# Patient Record
Sex: Male | Born: 1955 | Race: White | Hispanic: No | Marital: Married | Smoking: Never smoker
Health system: Southern US, Community
[De-identification: ages and names within clinical notes are randomized; demographics above are authoritative.]

## PROBLEM LIST (undated history)

## (undated) DIAGNOSIS — M069 Rheumatoid arthritis, unspecified: Secondary | ICD-10-CM

## (undated) DIAGNOSIS — Z91018 Allergy to other foods: Secondary | ICD-10-CM

## (undated) DIAGNOSIS — G4730 Sleep apnea, unspecified: Secondary | ICD-10-CM

## (undated) HISTORY — PX: CHOLECYSTECTOMY: SHX55

## (undated) HISTORY — PX: TONSILLECTOMY: SUR1361

## (undated) HISTORY — DX: Allergy to other foods: Z91.018

## (undated) HISTORY — DX: Rheumatoid arthritis, unspecified: M06.9

---

## 2000-10-27 HISTORY — PX: ANTERIOR FUSION CERVICAL SPINE: SUR626

## 2008-05-25 ENCOUNTER — Inpatient Hospital Stay (HOSPITAL_COMMUNITY): Admission: RE | Admit: 2008-05-25 | Discharge: 2008-05-29 | Payer: Self-pay | Admitting: Neurosurgery

## 2008-10-27 HISTORY — PX: OTHER SURGICAL HISTORY: SHX169

## 2008-10-27 HISTORY — PX: LUMBAR FUSION: SHX111

## 2009-11-13 IMAGING — CR DG CHEST 2V
2 series · 2 of 2 positions shown · non-contrast
Comparison: None

CLINICAL DATA: Preadmission for lumbar surgery

CHEST - 2 VIEW

[view not recorded (1 of 2)]
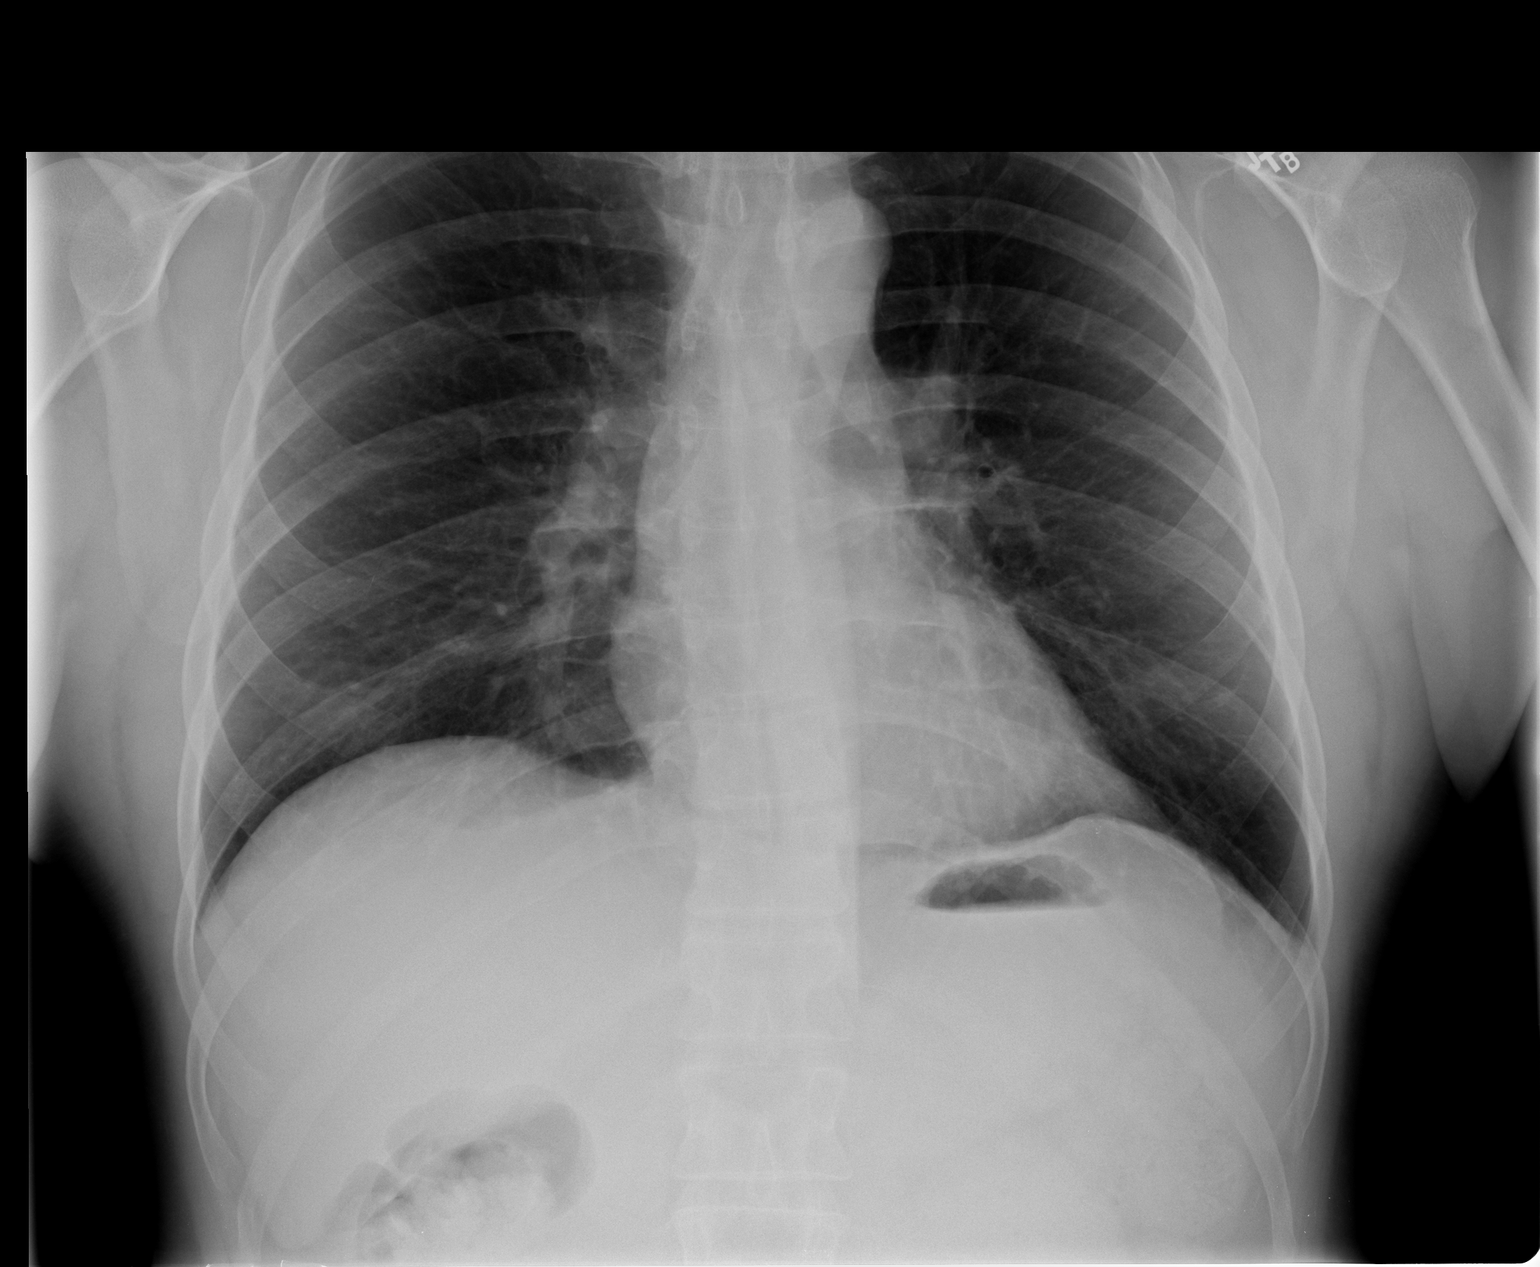

[view not recorded (2 of 2)]
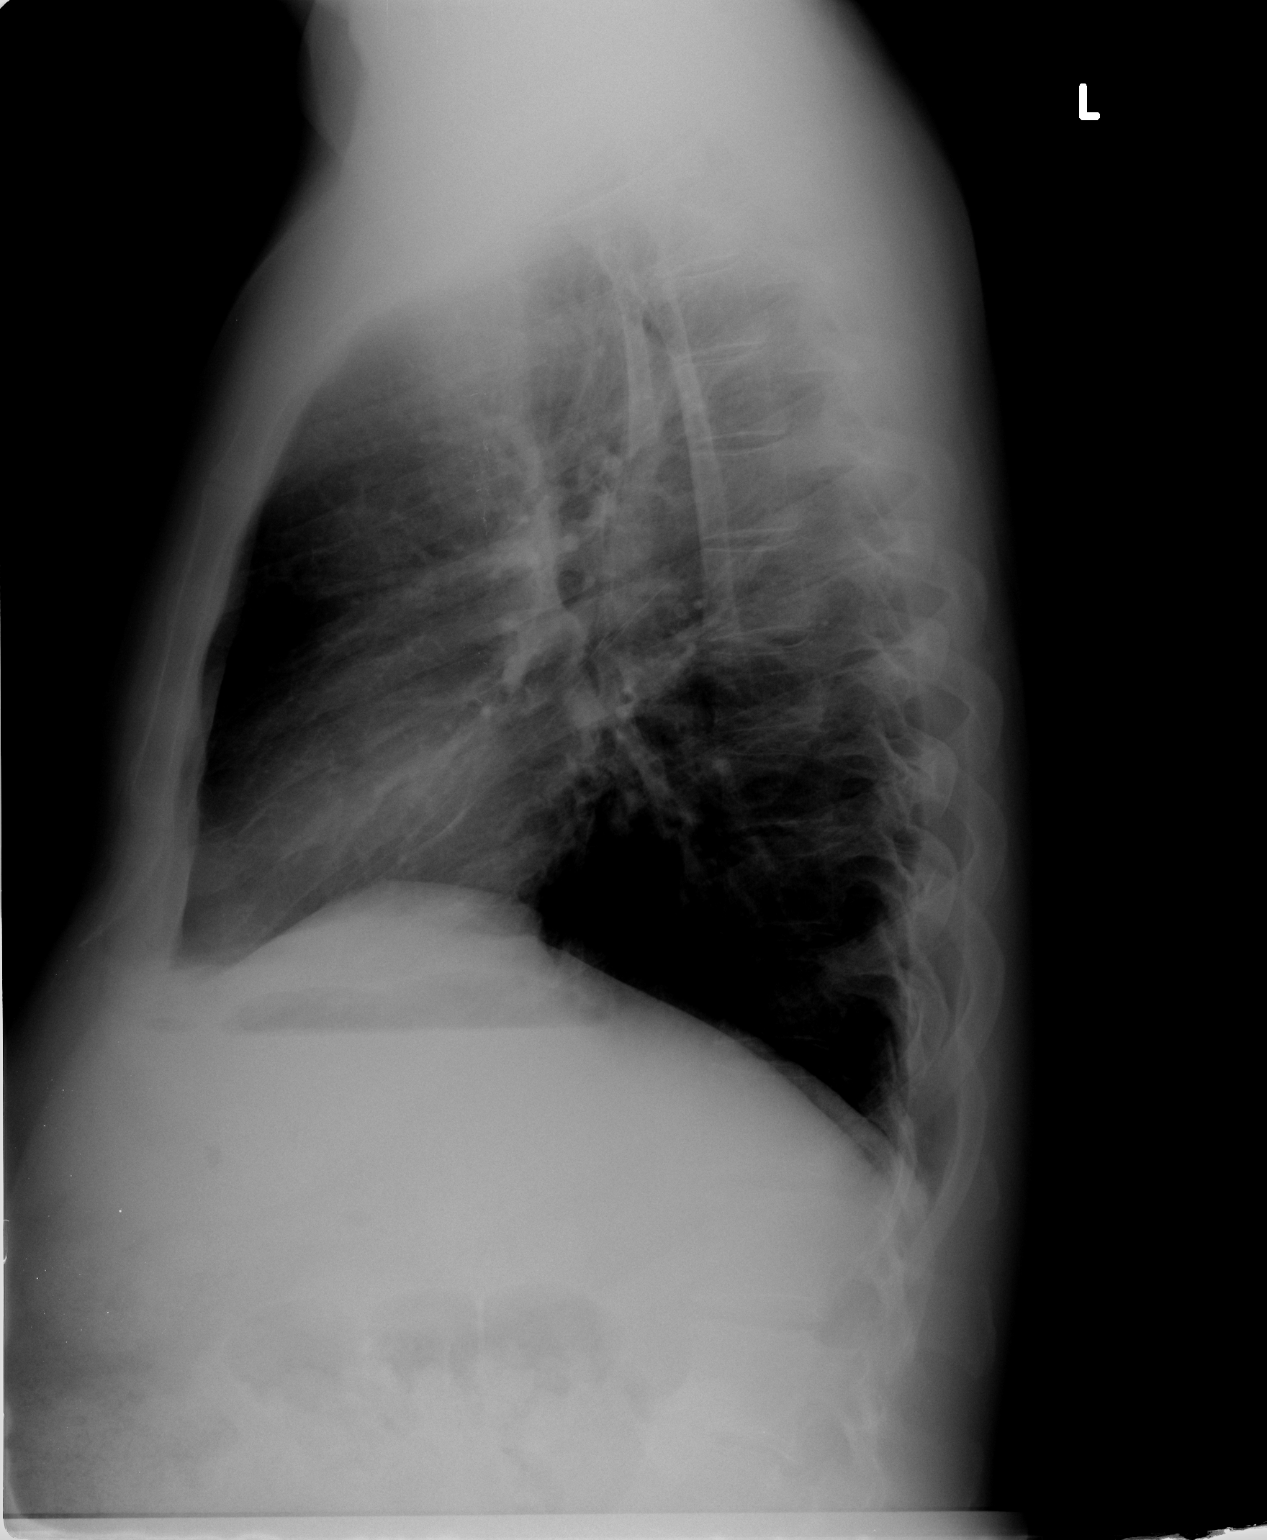

[2 of 2 positions shown; findings below may reference images not displayed]

FINDINGS: Cardiomediastinal silhouette is unremarkable.  No acute
infiltrate or pleural effusion.  No pulmonary edema.  Bony thorax
is unremarkable.
IMPRESSION: No active cardiopulmonary disease.

## 2009-11-19 IMAGING — CR DG LUMBAR SPINE 2-3V
1 series · 1 of 1 positions shown · non-contrast
Comparison: None

CLINICAL DATA: History is given of lumbar spondylosis and
degenerative disc disease and radiculopathy.  History given of
surgery for L2-L3 decompression and fusion.  Localization images
were obtained.

LUMBAR SPINE - 2-3 VIEW

[view not recorded]
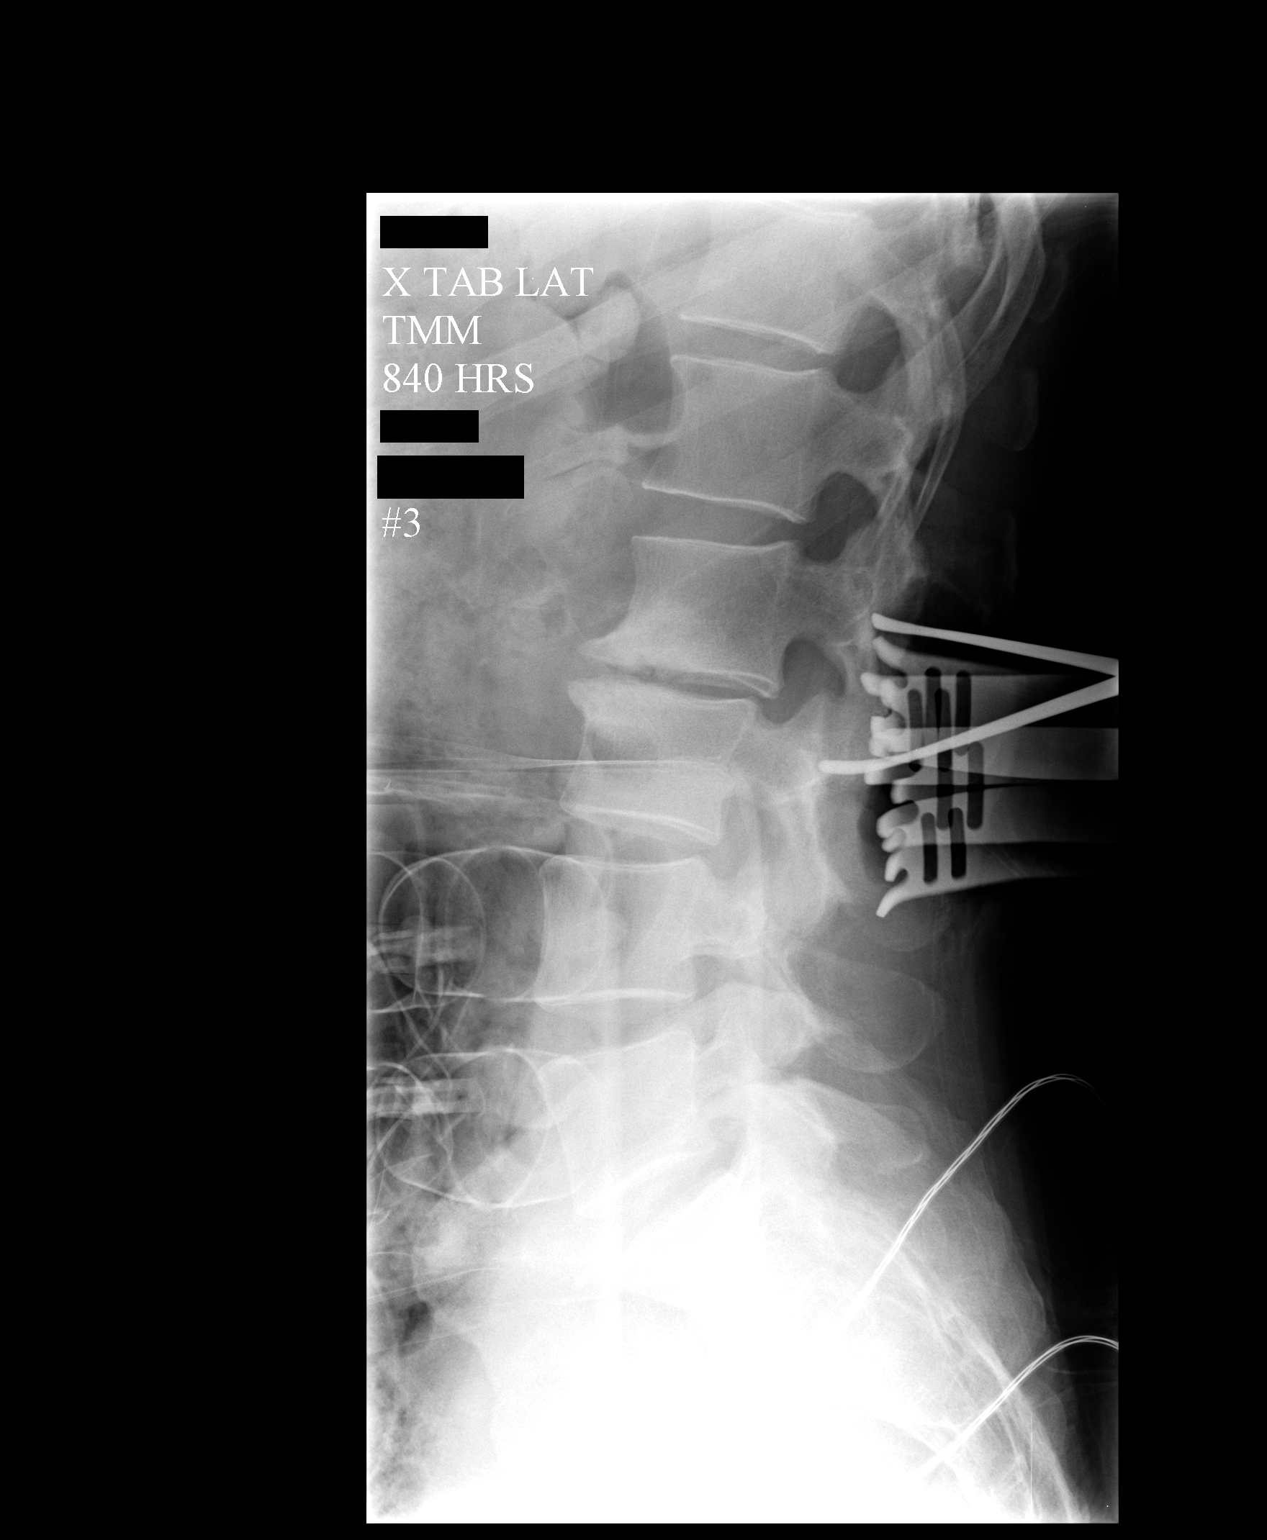

[1 of 1 positions shown; findings below may reference images not displayed]

FINDINGS: On the cross-table image obtained at 8872 hours metallic
instrument is seen with its tip posterior to the L3 level.  A
second metallic instrument has its tip posterior to the L4 level.

On the cross-table portable lateral examination obtained at 3438
hours the tip of a metallic instrument is seen posterior to the L2
level.

On the cross-table lateral image obtained at 8938 hours a metallic
instrument has its tip posterior to the L2 level and another
metallic instrument has its tip posterior to the L3 level.
IMPRESSION: Localization images obtained.

## 2010-04-26 HISTORY — PX: COLONOSCOPY: SHX174

## 2011-03-11 NOTE — Op Note (Signed)
Shawn Morgan, Shawn Morgan             ACCOUNT NO.:  [id number]   MEDICAL RECORD NO.:  [id number]          PATIENT TYPE:  INP   LOCATION:  3041                         FACILITY:  [facility]   PHYSICIAN:  Danae Orleans. Venetia Maxon, M.D.  DATE OF BIRTH:  1956/09/25   DATE OF PROCEDURE:  05/25/2008  DATE OF DISCHARGE:                               OPERATIVE REPORT   PREOPERATIVE DIAGNOSES:  Herniated lumbar disk with spondylosis,  spondylolisthesis, degenerative disease and radiculopathy L2-L3 level.   POSTOPERATIVE DIAGNOSES:  Herniated lumbar disk with spondylosis,  spondylolisthesis, degenerative disease and radiculopathy L2-L3 level.   PROCEDURE:  L2-L3 decompressive laminectomy with diskectomy, PLIF fusion  with PEEK interbody cage with morselized bone autograft, bone  morphogenic protein, nonsegmental instrumentation L2-L3 levels with  posterolateral arthrodesis utilizing autograft BMP and FortrOss.   SURGEON:  Danae Orleans. Venetia Maxon, MD   ASSISTANTS:  Georgiann Cocker, RN and Dr. Lovell Sheehan.   ANESTHESIA:  General endotracheal anesthesia.   ESTIMATED BLOOD LOSS:  650 mL with 205 mL Cell Saver blood returned to  the patient.   COMPLICATIONS:  None.   DISPOSITION:  Recovery.   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 with severe  intractable back pain with a markedly degenerated L2-L3 disk level with  rheumatoid arthritis.  It was elected to take him to surgery for  decompression and fusion at this affected level.   PROCEDURE:  [name] was brought to the operating room.  Following a  satisfactory and uncomplicated induction of general endotracheal  anesthesia plus intravenous lines, the patient was placed in a prone  position on the [place] table.  His low back was shaved, prepped and  draped in usual sterile fashion.  The planned incision was infiltrated  with 0.25% Marcaine and 0.50% lidocaine with 1:200,000 epinephrine.  Incision was made midline after localizing radiograph was  obtained with  the spinal needle which was at the level of the L2 pedicles.  The  incision was carried to the lumbodorsal fascia and was incised  bilaterally.  A subperiosteal dissection was performed exposing the  transverse processes of L3.  A marker probe was placed and demonstrated  a marker at L3-L4 level and subsequently exposure was carried cephalad  to expose the L2-L3 transverse processes.  Self-retaining retractor was  placed.  A hemilaminectomy was performed on the right side of the  midline and decompressing the thecal sac at L2 and L3 nerve roots.  There was marked spondylosis at this level and an osteophyte tool was  used to remove the retrolisthesed aspect of L2 disk.  Lamina spreaders  were placed to facilitate exposure of the interspace.  Interspace was  incised.  Disk material was removed in piecemeal fashion.  Endplates  were stripped of residual disk material.  After an initial attempt to  place a TLIF cage, it was found that there was not sufficient  distraction to be able to do so and subsequently it was elected to place  pedicle screws on the left side of midline.  These were 50 mm x 5.5-mm  screws placed at L3 and 45 x 5.5-mm screws placed  at L2, a 50-mm rod was  placed in the interspaces distracted.  Using this additional  distraction, the endplates were further prepared for grafting and  subsequently a 7-mm banana shaped medium TLIF cage was packed with extra  small BMP and FortrOss.  Additional small piece of BMP and bone  autograft was inserted in the interspace and tamped into position.  The  implant was then placed into the interspace and countersunk  appropriately.  Additional bone autograft was placed overlying the  implant and tamped into position.  A 45 x 5.5-mm screws were then placed  at L2-L3 on the right and subsequently AP and lateral fluoroscopy was  used to confirm correct orientation of implants and well-positioned  pedicle screw fixation.  The  facet joint complex of L2-L3 on the left  was then decorticated as were the overlying laminar bone and additional  remaining BMP and FortrOss was then placed and tamped into position.  The 50-mm rods were then placed and locked down in situ and torqued  appropriately.  The self-retaining retractor was removed.  The  lumbodorsal fascia was closed with 1 Vicryl sutures, subcutaneous  tissues were approximated with 2-0 Vicryl interrupted inverted sutures  and skin edges were approximated with interrupted 3-0 Vicryl  subcuticular stitch.  The wound was dressed with Benzoin, Steri-Strips,  Telfa gauze and tape.  The patient was extubated in the operating room  and returned to recovery in stable and satisfactory condition having  tolerated this operation well.  Counts were correct at the end of the  case.      Danae Orleans. Venetia Maxon, M.D.  Electronically Signed     JDS/MEDQ  D:  05/25/2008  T:  05/25/2008  Job:  [id number]

## 2011-03-14 NOTE — Discharge Summary (Signed)
NAMEJATHEN, SUDANO             ACCOUNT NO.:  [id number]   MEDICAL RECORD NO.:  [id number]          PATIENT TYPE:  INP   LOCATION:  3041                         FACILITY:  [facility]   PHYSICIAN:  Danae Orleans. Venetia Maxon, M.D.  DATE OF BIRTH:  1956-09-12   DATE OF ADMISSION:  05/25/2008  DATE OF DISCHARGE:  05/29/2008                               DISCHARGE SUMMARY   REASON FOR ADMISSION:  Lumbar disk displacement, lumbosacral  spondylosis, lumbar disk degeneration, lumbosacral neuritis or lumbar  radiculopathy.   ADDITIONAL DIAGNOSES:  1. Esophageal reflux.  2. Asthma without status asthmaticus.  3. Status post arthrodesis.  4. Rheumatoid arthritis.  5. History of thoracic aortic aneurysm.   FINAL DIAGNOSES:  1. Esophageal reflux.  2. Asthma without status asthmaticus.  3. Status post arthrodesis.  4. Rheumatoid arthritis.  5. History of thoracic aortic aneurysm.   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  He has a history of rheumatoid  arthritis who has undergone extensive but unsuccessful conservative  management of his back pain.  It was elected to take him to surgery for  decompression and fusion at the L2-3 level.   HOSPITAL COURSE:  Mr. Quinney underwent uncomplicated decompression and  fusion at L2-3.  He subsequently was gradually immobilized and had a  Foley catheter which was discontinued.  He was doing well on the third  was discharged home in stable satisfactory condition having  tolerated  his operation and hospitalization well.   DISCHARGE MEDICATIONS:  Percocet 1 to 2 every 4 hours as needed for pain  with instructions to follow up in the office in 3-4 weeks with  radiographs, to wear a brace when up and walking.      Danae Orleans. Venetia Maxon, M.D.  Electronically Signed     JDS/MEDQ  D:  06/28/2008  T:  06/29/2008  Job:   [id number]

## 2011-07-25 LAB — CBC
MCHC: 34.7
RBC: 4.11 — ABNORMAL LOW
WBC: 7.8

## 2011-07-25 LAB — BASIC METABOLIC PANEL
Calcium: 8.9
Creatinine, Ser: 0.9
GFR calc Af Amer: >60
GFR calc non Af Amer: >60
Sodium: 136

## 2011-07-25 LAB — TYPE AND SCREEN
ABO/RH(D): O POS
Antibody Screen: NEGATIVE

## 2011-07-25 LAB — ABO/RH: ABO/RH(D): O POS

## 2013-10-27 DIAGNOSIS — E2740 Unspecified adrenocortical insufficiency: Secondary | ICD-10-CM

## 2013-10-27 DIAGNOSIS — E039 Hypothyroidism, unspecified: Secondary | ICD-10-CM

## 2013-10-27 HISTORY — DX: Hypothyroidism, unspecified: E03.9

## 2013-10-27 HISTORY — DX: Unspecified adrenocortical insufficiency: E27.40

## 2016-05-19 DIAGNOSIS — D229 Melanocytic nevi, unspecified: Secondary | ICD-10-CM

## 2016-05-19 HISTORY — DX: Melanocytic nevi, unspecified: D22.9

## 2018-06-22 ENCOUNTER — Encounter: Payer: Self-pay | Admitting: Gastroenterology

## 2018-09-21 ENCOUNTER — Ambulatory Visit: Payer: Self-pay | Admitting: Gastroenterology

## 2018-11-16 ENCOUNTER — Encounter: Payer: Self-pay | Admitting: Gastroenterology

## 2018-12-13 ENCOUNTER — Ambulatory Visit: Payer: Self-pay | Admitting: Gastroenterology

## 2018-12-14 ENCOUNTER — Ambulatory Visit: Payer: Self-pay | Admitting: Gastroenterology

## 2019-02-01 ENCOUNTER — Ambulatory Visit: Payer: Self-pay | Admitting: Gastroenterology

## 2019-04-26 ENCOUNTER — Ambulatory Visit: Payer: Self-pay | Admitting: Nurse Practitioner

## 2020-01-18 ENCOUNTER — Encounter: Payer: Self-pay | Admitting: Gastroenterology

## 2020-01-26 ENCOUNTER — Encounter: Payer: Self-pay | Admitting: Gastroenterology

## 2020-01-26 ENCOUNTER — Telehealth: Payer: Self-pay

## 2020-01-26 ENCOUNTER — Other Ambulatory Visit: Payer: Self-pay

## 2020-01-26 ENCOUNTER — Ambulatory Visit: Payer: Medicare PPO | Admitting: Gastroenterology

## 2020-01-26 DIAGNOSIS — Z1211 Encounter for screening for malignant neoplasm of colon: Secondary | ICD-10-CM | POA: Insufficient documentation

## 2020-01-26 DIAGNOSIS — K219 Gastro-esophageal reflux disease without esophagitis: Secondary | ICD-10-CM | POA: Insufficient documentation

## 2020-01-26 DIAGNOSIS — K859 Acute pancreatitis without necrosis or infection, unspecified: Secondary | ICD-10-CM

## 2020-01-26 DIAGNOSIS — K831 Obstruction of bile duct: Secondary | ICD-10-CM

## 2020-01-26 HISTORY — DX: Acute pancreatitis without necrosis or infection, unspecified: K83.1

## 2020-01-26 HISTORY — DX: Acute pancreatitis without necrosis or infection, unspecified: K85.90

## 2020-01-26 NOTE — Patient Instructions (Addendum)
DRINK WATER TO KEEP YOUR URINE LIGHT YELLOW.  AVOID REFLUX TRIGGERS. SEE INFO BELOW.  FOLLOW A HIGH FIBER DIET. AVOID ITEMS THAT CAUSE BLOATING & GAS.   CONTINUE OMEPRAZOLE.  TAKE 30 MINUTES PRIOR TO YOUR FIRST MEAL.   COMPLETE COLONOSCOPY AFTER MAY 9 WITH DR. Gala Romney AND PROPOFOL.  FOLLOW UP IN THE OFFICE WILL BE SCHEDULED IF NEEDED AFTER ENDOSCOPY.   Lifestyle and home remedies TO CONTROL HEARTBURN You may eliminate or reduce the frequency of heartburn by making the following lifestyle changes:  . Control your weight. Being overweight is a major risk factor for heartburn and GERD. Excess pounds put pressure on your abdomen, pushing up your stomach and causing acid to back up into your esophagus.   . Eat smaller meals. 4 TO 6 MEALS A DAY. This reduces pressure on the lower esophageal sphincter, helping to prevent the valve from opening and acid from washing back into your esophagus.   Dolphus Jenny your belt. Clothes that fit tightly around your waist put pressure on your abdomen and the lower esophageal sphincter.   . Eliminate heartburn triggers. Everyone has specific triggers. Common triggers such as fatty or fried foods, spicy food, tomato sauce, carbonated beverages, alcohol, chocolate, mint, garlic, onion, caffeine and nicotine may make heartburn worse.   Marland Kitchen Avoid stooping or bending. Tying your shoes is OK. Bending over for longer periods to weed your garden isn't, especially soon after eating.   . Don't lie down after a meal. Wait at least three to four hours after eating before going to bed, and don't lie down right after eating.   Alternative medicine . Several home remedies exist for treating GERD, but they provide only temporary relief. They include drinking baking soda (sodium bicarbonate) added to water or drinking other fluids such as baking soda mixed with cream of tartar and water. . Although these liquids create temporary relief by neutralizing, washing away or buffering  acids, eventually they aggravate the situation by adding gas and fluid to your stomach, increasing pressure and causing more acid reflux. Further, adding more sodium to your diet may increase your blood pressure and add stress to your heart, and excessive bicarbonate ingestion can alter the acid-base balance in your body.

## 2020-01-26 NOTE — Telephone Encounter (Signed)
PA for TCS submitted via [employer] website. Case approved. [employer]# [id number], valid 05/03/20-06/02/20.

## 2020-01-26 NOTE — Progress Notes (Signed)
Cc'ed to pcp °

## 2020-01-26 NOTE — Progress Notes (Signed)
Subjective:    Patient ID: Shawn Morgan, male    DOB: January 15, 1956, 64 y.o.   MRN: [id number] Keane Police, MD  HPI ALWAYS HAD SEVERE INDIGESTION. TRIED CHANGING DIET AND STRATEGY. BEEN ON PPI FOR AS LONG AS HE COULD REMEMBER. TREATED FOR H PYLORI. BELLY PAIN OFF AND ON. DEVELOPED ADRENAL INSUFFICIENCY AND HAS TROUBLE WITH ABDOMINAL PAIN ASSOCIATED WITH IT AS WELL AS ABDOMINAL BLOATING. RECENTLY OFF STEROID SINCE AUG AND BOUTS OFF AND ON BUT MILDER AND FATIGUE(SEVERE). IN PAST 3 WEEKS STARTED HAVING SYMPTOMS AGAIN. NOW BACK ON HCT AND THINGS HAVE CAL;MED DOWN NOW. DUE FOR COLONOSCOPY. NAUSEA WAS 24/7 BUT NOW BETTER. GETS WAVES OF NAUSEA. FATIGUE IS BETTER. WAS HAVING TROUBLE ORTHOSTATIC HYPOTENSION. BMs: 3-4X ON  ONE DAY BUT NOT COMPLETE AND THEN HARD AND THEN DIARRHEA. HEARTBURN: CONTROLLED WITH OMEPRAZOLE UNLESS HE MISSES A DOSE. HAS HIATAL HERNIA. HAD SEVERAL EGDs : GASTRITIS/ESOPHAGITIS-NO BARRETT'S ESOPHAGUS.  PT DENIES FEVER, CHILLS, HEMATOCHEZIA, HEMATEMESIS, vomiting, melena, CHEST PAIN, SHORTNESS OF BREATH, CHANGE IN BOWEL IN HABITS, problems swallowing, problems with sedation, OR heartburn or indigestion.  Past Medical History:  Diagnosis Date  . Adrenal insufficiency ([place]) 2015  . Allergy to alpha-gal   . Atypical nevus 05/19/2016   Mid Back-Mild  . Hypothyroidism 2015  . Rheumatoid arthritis ([place])    PSORIATIC INITIALLY AT AGE 73-30   Past Surgical History:  Procedure Laterality Date  . ARTHROSCOPY Right 2010  . COLONOSCOPY  04/2010  . LUMBAR FUSION  2010  . VASECTOMY  2010    Allergies  Allergen Reactions  . Beef (Bovine) Protein Anaphylaxis  . Golimumab Anaphylaxis  . Other Anaphylaxis    Symponi area. Can't take biologic componants   Current Outpatient Medications  Medication Sig    . Azelastine HCl 0.15 % SOLN as needed. For alpha gal allergy    . divalproex (DEPAKOTE) 500 MG DR tablet Take 1 tablet by mouth at bedtime.    . folic acid (FOLVITE) 1 MG  tablet Take 1 tablet by mouth daily.    . hydrocortisone (CORTEF) 10 MG tablet 10mg  in the morning and 5mg  in evening    . levothyroxine (SYNTHROID) 100 MCG tablet Take 1 tablet by mouth daily.    Marland Kitchen loratadine (CLARITIN) 10 MG tablet Take 1 tablet by mouth. 1-2 times per day    . omeprazole (PRILOSEC) 40 MG capsule Take 40 mg by mouth daily.    . ondansetron (ZOFRAN) 4 MG tablet as needed.    . tamsulosin (FLOMAX) 0.4 MG CAPS capsule Take 1 capsule by mouth at bedtime.    . traMADol (ULTRAM) 50 MG tablet Take 1-2 tablets by mouth daily. For RA     Family History  Problem Relation Age of Onset  . Breast cancer Mother   . Angioedema Father        ISCHEMIC BOWEL  . Colon cancer Neg Hx   . Colon polyps Neg Hx    Social History   Socioeconomic History  . Marital status: Married    Spouse name: Not on file  . Number of children: Not on file  . Years of education: Not on file  . Highest education level: Not on file  Occupational History  . Not on file  Tobacco Use  . Smoking status: Never Smoker  . Smokeless tobacco: Never Used  Substance and Sexual Activity  . Alcohol use: Never  . Drug use: Never  . Sexual activity: Not on file  Other Topics Concern  .  Not on file  Social History Narrative   RETIRED CRNA SINCE 2009. MARRIED(2000)-kids(four), grandkids(3). SPENDS FREE TIME: PLANTING FLOWERS, "YARD NUT", HUNTING(DEER, RABBIT), FISHING. WIFE IS A FORMER NEONATAL NURSE.   Social Determinants of Health   Financial Resource Strain:   . Difficulty of Paying Living Expenses:   Food Insecurity:   . Worried About Charity fundraiser in the Last Year:   . Arboriculturist in the Last Year:   Transportation Needs:   . Film/video editor (Medical):   Marland Kitchen Lack of Transportation (Non-Medical):   Physical Activity:   . Days of Exercise per Week:   . Minutes of Exercise per Session:   Stress:   . Feeling of Stress :   Social Connections:   . Frequency of Communication with Friends  and Family:   . Frequency of Social Gatherings with Friends and Family:   . Attends Religious Services:   . Active Member of Clubs or Organizations:   . Attends Archivist Meetings:   Marland Kitchen Marital Status:    Review of Systems PER HPI OTHERWISE ALL SYSTEMS ARE NEGATIVE.    Objective:   Physical Exam Constitutional:      General: He is not in acute distress.    Appearance: Normal appearance.  HENT:     Mouth/Throat:     Comments: MASK IN PLACE Eyes:     General: No scleral icterus.    Pupils: Pupils are equal, round, and reactive to light.  Cardiovascular:     Rate and Rhythm: Normal rate and regular rhythm.     Pulses: Normal pulses.     Heart sounds: Normal heart sounds.  Pulmonary:     Effort: Pulmonary effort is normal.     Breath sounds: Normal breath sounds.  Abdominal:     General: Bowel sounds are normal.     Palpations: Abdomen is soft.     Tenderness: There is no abdominal tenderness.  Musculoskeletal:     Cervical back: Normal range of motion.     Right lower leg: No edema.     Left lower leg: No edema.  Lymphadenopathy:     Cervical: No cervical adenopathy.  Skin:    General: Skin is warm and dry.  Neurological:     Mental Status: He is alert and oriented to person, place, and time.     Comments: NO  NEW FOCAL DEFICITS  Psychiatric:        Mood and Affect: Mood normal.     Comments: NORMAL AFFECT       Assessment & Plan:

## 2020-01-26 NOTE — Assessment & Plan Note (Signed)
AVERAGE RISK.  DRINK WATER TO KEEP YOUR URINE LIGHT YELLOW. FOLLOW A HIGH FIBER DIET. AVOID ITEMS THAT CAUSE BLOATING & GAS. COMPLETE COLONOSCOPY AFTER MAY 9 WITH DR. Gala Romney AND PROPOFOL DUE TO ADDISON'S AND DEPAKOTE. DISCUSSED PROCEDURE, BENEFITS, & RISKS: < 1% chance of medication reaction, bleeding, perforation, ASPIRATION, or rupture of spleen/liver requiring surgery to fix it and missed polyps < 1 cm 10-20% of the time.  FOLLOW UP IN THE OFFICE WILL BE SCHEDULED IF NEEDED AFTER ENDOSCOPY.

## 2020-01-26 NOTE — Assessment & Plan Note (Signed)
SYMPTOMS FAIRLY WELL CONTROLLED ON MEDS.  DRINK WATER TO KEEP YOUR URINE LIGHT YELLOW. AVOID REFLUX TRIGGERS. CONTINUE OMEPRAZOLE.  TAKE 30 MINUTES PRIOR TO YOUR FIRST MEAL. FOLLOW UP IN THE OFFICE WILL BE SCHEDULED IF NEEDED AFTER ENDOSCOPY.

## 2020-05-01 ENCOUNTER — Encounter (HOSPITAL_COMMUNITY): Payer: Self-pay

## 2020-05-01 ENCOUNTER — Other Ambulatory Visit (HOSPITAL_COMMUNITY)
Admission: RE | Admit: 2020-05-01 | Discharge: 2020-05-01 | Disposition: A | Discharge status: Home / Self Care | Payer: Medicare PPO | Source: Ambulatory Visit | Attending: Internal Medicine | Admitting: Internal Medicine

## 2020-05-01 ENCOUNTER — Other Ambulatory Visit: Payer: Self-pay

## 2020-05-01 ENCOUNTER — Encounter (HOSPITAL_COMMUNITY)
Admission: RE | Admit: 2020-05-01 | Discharge: 2020-05-01 | Disposition: A | Discharge status: Home / Self Care | Payer: Medicare PPO | Source: Ambulatory Visit | Attending: Internal Medicine | Admitting: Internal Medicine

## 2020-05-01 DIAGNOSIS — Z20822 Contact with and (suspected) exposure to covid-19: Secondary | ICD-10-CM | POA: Diagnosis not present

## 2020-05-01 DIAGNOSIS — Z01812 Encounter for preprocedural laboratory examination: Secondary | ICD-10-CM | POA: Diagnosis present

## 2020-05-01 HISTORY — DX: Sleep apnea, unspecified: G47.30

## 2020-05-01 LAB — SARS CORONAVIRUS 2 (TAT 6-24 HRS): SARS Coronavirus 2: NEGATIVE

## 2020-05-03 ENCOUNTER — Encounter (HOSPITAL_COMMUNITY)
Admission: RE | Disposition: A | Discharge status: Home / Self Care | Payer: Self-pay | Source: Home / Self Care | Attending: Internal Medicine

## 2020-05-03 ENCOUNTER — Ambulatory Visit (HOSPITAL_COMMUNITY)
Admission: RE | Admit: 2020-05-03 | Discharge: 2020-05-03 | Disposition: A | Discharge status: Home / Self Care | Payer: Medicare PPO | Attending: Internal Medicine | Admitting: Internal Medicine

## 2020-05-03 ENCOUNTER — Ambulatory Visit (HOSPITAL_COMMUNITY): Payer: Medicare PPO | Admitting: Anesthesiology

## 2020-05-03 ENCOUNTER — Other Ambulatory Visit: Payer: Self-pay

## 2020-05-03 ENCOUNTER — Encounter (HOSPITAL_COMMUNITY): Payer: Self-pay | Admitting: Internal Medicine

## 2020-05-03 DIAGNOSIS — G4730 Sleep apnea, unspecified: Secondary | ICD-10-CM | POA: Insufficient documentation

## 2020-05-03 DIAGNOSIS — Z79899 Other long term (current) drug therapy: Secondary | ICD-10-CM | POA: Diagnosis not present

## 2020-05-03 DIAGNOSIS — K219 Gastro-esophageal reflux disease without esophagitis: Secondary | ICD-10-CM | POA: Insufficient documentation

## 2020-05-03 DIAGNOSIS — Z79890 Hormone replacement therapy (postmenopausal): Secondary | ICD-10-CM | POA: Diagnosis not present

## 2020-05-03 DIAGNOSIS — K640 First degree hemorrhoids: Secondary | ICD-10-CM | POA: Diagnosis not present

## 2020-05-03 DIAGNOSIS — I714 Abdominal aortic aneurysm, without rupture: Secondary | ICD-10-CM | POA: Diagnosis not present

## 2020-05-03 DIAGNOSIS — L4050 Arthropathic psoriasis, unspecified: Secondary | ICD-10-CM | POA: Insufficient documentation

## 2020-05-03 DIAGNOSIS — E2740 Unspecified adrenocortical insufficiency: Secondary | ICD-10-CM | POA: Insufficient documentation

## 2020-05-03 DIAGNOSIS — M069 Rheumatoid arthritis, unspecified: Secondary | ICD-10-CM | POA: Diagnosis not present

## 2020-05-03 DIAGNOSIS — Z1211 Encounter for screening for malignant neoplasm of colon: Secondary | ICD-10-CM | POA: Diagnosis present

## 2020-05-03 DIAGNOSIS — Z888 Allergy status to other drugs, medicaments and biological substances status: Secondary | ICD-10-CM | POA: Insufficient documentation

## 2020-05-03 DIAGNOSIS — I739 Peripheral vascular disease, unspecified: Secondary | ICD-10-CM | POA: Insufficient documentation

## 2020-05-03 DIAGNOSIS — E039 Hypothyroidism, unspecified: Secondary | ICD-10-CM | POA: Insufficient documentation

## 2020-05-03 DIAGNOSIS — Z91018 Allergy to other foods: Secondary | ICD-10-CM | POA: Insufficient documentation

## 2020-05-03 HISTORY — PX: COLONOSCOPY WITH PROPOFOL: SHX5780

## 2020-05-03 SURGERY — COLONOSCOPY WITH PROPOFOL
Anesthesia: General

## 2020-05-03 MED ORDER — PHENYLEPHRINE HCL (PRESSORS) 10 MG/ML IV SOLN
INTRAVENOUS | Status: DC | PRN
  Administered 2020-05-03: 100 ug via INTRAVENOUS

## 2020-05-03 MED ORDER — LIDOCAINE 2% (20 MG/ML) 5 ML SYRINGE
INTRAMUSCULAR | Status: AC
  Filled 2020-05-03: qty 5

## 2020-05-03 MED ORDER — PROPOFOL 10 MG/ML IV BOLUS
INTRAVENOUS | Status: AC
  Filled 2020-05-03: qty 80

## 2020-05-03 MED ORDER — LACTATED RINGERS IV SOLN
Freq: Once | INTRAVENOUS | Status: AC
  Administered 2020-05-03: 1000 mL via INTRAVENOUS

## 2020-05-03 MED ORDER — CHLORHEXIDINE GLUCONATE CLOTH 2 % EX PADS: 6.0000 | MEDICATED_PAD | Freq: Once | CUTANEOUS | Status: DC

## 2020-05-03 MED ORDER — LACTATED RINGERS IV SOLN
INTRAVENOUS | Status: DC | PRN
Start: 2020-05-03 — End: 2020-05-03

## 2020-05-03 MED ORDER — STERILE WATER FOR IRRIGATION IR SOLN
Status: DC | PRN
  Administered 2020-05-03: 1.5 mL

## 2020-05-03 MED ORDER — PROPOFOL 500 MG/50ML IV EMUL
INTRAVENOUS | Status: DC | PRN
  Administered 2020-05-03 (×2): 30 mg via INTRAVENOUS
  Administered 2020-05-03: 20 mg via INTRAVENOUS
  Administered 2020-05-03: 30 mg via INTRAVENOUS
  Administered 2020-05-03: 100 mg via INTRAVENOUS

## 2020-05-03 MED ORDER — LIDOCAINE HCL (CARDIAC) PF 50 MG/5ML IV SOSY
PREFILLED_SYRINGE | INTRAVENOUS | Status: DC | PRN
  Administered 2020-05-03: 100 mg via INTRAVENOUS

## 2020-05-03 MED ORDER — DEXAMETHASONE SODIUM PHOSPHATE 4 MG/ML IJ SOLN
INTRAMUSCULAR | Status: DC | PRN
Start: 2020-05-03 — End: 2020-05-03
  Administered 2020-05-03: 4 mg via INTRAVENOUS

## 2020-05-03 MED ORDER — DEXAMETHASONE SODIUM PHOSPHATE 4 MG/ML IJ SOLN
INTRAMUSCULAR | Status: AC
  Filled 2020-05-03: qty 1

## 2020-05-03 NOTE — Transfer of Care (Signed)
Immediate Anesthesia Transfer of Care Note  Patient: Shawn Morgan  Procedure(s) Performed: COLONOSCOPY WITH PROPOFOL (N/A )  Patient Location: PACU  Anesthesia Type:General  Level of Consciousness: awake, alert , oriented and patient cooperative  Airway & Oxygen Therapy: Patient Spontanous Breathing and Patient connected to nasal cannula oxygen  Post-op Assessment: Report given to RN, Post -op Vital signs reviewed and stable and Patient moving all extremities  Post vital signs: Reviewed and stable  Last Vitals:  Vitals Value Taken Time  BP 97/61 05/03/20 1304  Temp    Pulse 65 05/03/20 1304  Resp 24 05/03/20 1304  SpO2 96 % 05/03/20 1304  Vitals shown include unvalidated device data.  Last Pain:  Vitals:   05/03/20 1237  TempSrc:   PainSc: 4       Patients Stated Pain Goal: 8 (60/04/59 9774)  Complications: No complications documented.

## 2020-05-03 NOTE — Op Note (Signed)
[facility] Patient Name: Shawn Morgan Procedure Date: 05/03/2020 12:29 PM MRN: [id number] Date of Birth: [birth year] Attending MD: Norvel Richards , MD CSN: [id number] Age: 64 Admit Type: Outpatient Procedure:                Colonoscopy Indications:              Screening for colorectal malignant neoplasm Providers:                Norvel Richards, MD, Charlsie Quest. Theda Sers RN, RN,                            Aram Candela Referring MD:              Medicines:                Propofol per Anesthesia Complications:            No immediate complications. Estimated Blood Loss:     Estimated blood loss: none. Procedure:                Pre-Anesthesia Assessment:                           - Prior to the procedure, a History and Physical                            was performed, and patient medications and                            allergies were reviewed. The patient's tolerance of                            previous anesthesia was also reviewed. The risks                            and benefits of the procedure and the sedation                            options and risks were discussed with the patient.                            All questions were answered, and informed consent                            was obtained. Prior Anticoagulants: The patient has                            taken no previous anticoagulant or antiplatelet                            agents. ASA Grade Assessment: II - A patient with                            mild systemic disease. After reviewing the risks  and benefits, the patient was deemed in                            satisfactory condition to undergo the procedure.                           After obtaining informed consent, the colonoscope                            was passed under direct vision. Throughout the                            procedure, the patient's blood pressure, pulse, and                            oxygen  saturations were monitored continuously. The                            CF-HQ190L ([id number]) scope was introduced through                            the anus and advanced to the the cecum, identified                            by appendiceal orifice and ileocecal valve. The                            colonoscopy was performed without difficulty. The                            patient tolerated the procedure well. The quality                            of the bowel preparation was adequate. Scope In: 12:47:57 PM Scope Out: 12:58:58 PM Scope Withdrawal Time: 0 hours 8 minutes 19 seconds  Total Procedure Duration: 0 hours 11 minutes 1 second  Findings:      The perianal and digital rectal examinations were normal.      Non-bleeding internal hemorrhoids were found during retroflexion. The       hemorrhoids were moderate, medium-sized and Grade I (internal       hemorrhoids that do not prolapse).      The exam was otherwise without abnormality on direct and retroflexion       views. Impression:               - Non-bleeding internal hemorrhoids.                           - The examination was otherwise normal on direct                            and retroflexion views.                           - No specimens collected. Moderate Sedation:      Moderate (  conscious) sedation was personally administered by an       anesthesia professional. The following parameters were monitored: oxygen       saturation, heart rate, blood pressure, respiratory rate, EKG, adequacy       of pulmonary ventilation, and response to care. Recommendation:           - Patient has a contact number available for                            emergencies. The signs and symptoms of potential                            delayed complications were discussed with the                            patient. Return to normal activities tomorrow.                            Written discharge instructions were provided to the                             patient.                           - Resume previous diet.                           - Continue present medications.                           - Repeat colonoscopy in 10 years for screening                            purposes.                           - Return to GI office (date not yet determined). Procedure Code(s):        --- Professional ---                           [phone], Colonoscopy, flexible; diagnostic, including                            collection of specimen(s) by brushing or washing,                            when performed (separate procedure) Diagnosis Code(s):        --- Professional ---                           Z12.11, Encounter for screening for malignant                            neoplasm of colon                           K64.0, First degree hemorrhoids  CPT copyright 2019 American Medical Association. All rights reserved. The codes documented in this report are preliminary and upon coder review may  be revised to meet current compliance requirements. Cristopher Estimable. Dary Dilauro, MD Norvel Richards, MD 05/03/2020 2:20:53 PM This report has been signed electronically. Number of Addenda: 0

## 2020-05-03 NOTE — Discharge Instructions (Signed)
Colonoscopy Discharge Instructions  Read the instructions outlined below and refer to this sheet in the next few weeks. These discharge instructions provide you with general information on caring for yourself after you leave the hospital. Your doctor may also give you specific instructions. While your treatment has been planned according to the most current medical practices available, unavoidable complications occasionally occur. If you have any problems or questions after discharge, call Dr. Gala Romney at [phone]. ACTIVITY  You may resume your regular activity, but move at a slower pace for the next 24 hours.   Take frequent rest periods for the next 24 hours.   Walking will help get rid of the air and reduce the bloated feeling in your belly (abdomen).   No driving for 24 hours (because of the medicine (anesthesia) used during the test).    Do not sign any important legal documents or operate any machinery for 24 hours (because of the anesthesia used during the test).  NUTRITION  Drink plenty of fluids.   You may resume your normal diet as instructed by your doctor.   Begin with a light meal and progress to your normal diet. Heavy or fried foods are harder to digest and may make you feel sick to your stomach (nauseated).   Avoid alcoholic beverages for 24 hours or as instructed.  MEDICATIONS  You may resume your normal medications unless your doctor tells you otherwise.  WHAT YOU CAN EXPECT TODAY  Some feelings of bloating in the abdomen.   Passage of more gas than usual.   Spotting of blood in your stool or on the toilet paper.  IF YOU HAD POLYPS REMOVED DURING THE COLONOSCOPY:  No aspirin products for 7 days or as instructed.   No alcohol for 7 days or as instructed.   Eat a soft diet for the next 24 hours.  FINDING OUT THE RESULTS OF YOUR TEST Not all test results are available during your visit. If your test results are not back during the visit, make an appointment  with your caregiver to find out the results. Do not assume everything is normal if you have not heard from your caregiver or the medical facility. It is important for you to follow up on all of your test results.  SEEK IMMEDIATE MEDICAL ATTENTION IF:  You have more than a spotting of blood in your stool.   Your belly is swollen (abdominal distention).   You are nauseated or vomiting.   You have a temperature over 101.   You have abdominal pain or discomfort that is severe or gets worse throughout the day.   No polyps found today.  Good report.  I recommend 1 more colonoscopy in 10 years for screening purposes.  Of course, if you have any interim bowel symptoms, please let us know.  At patient request, I called Caesar Mannella at [phone] -voicemail message said "not set up yet"     Monitored Anesthesia Care, Care After These instructions provide you with information about caring for yourself after your procedure. Your health care provider may also give you more specific instructions. Your treatment has been planned according to current medical practices, but problems sometimes occur. Call your health care provider if you have any problems or questions after your procedure. What can I expect after the procedure? After your procedure, you may:  Feel sleepy for several hours.  Feel clumsy and have poor balance for several hours.  Feel forgetful about what happened after the procedure.  Have  poor judgment for several hours.  Feel nauseous or vomit.  Have a sore throat if you had a breathing tube during the procedure. Follow these instructions at home: For at least 24 hours after the procedure:      Have a responsible adult stay with you. It is important to have someone help care for you until you are awake and alert.  Rest as needed.  Do not: ? Participate in activities in which you could fall or become injured. ? Drive. ? Use heavy machinery. ? Drink  alcohol. ? Take sleeping pills or medicines that cause drowsiness. ? Make important decisions or sign legal documents. ? Take care of children on your own. Eating and drinking  Follow the diet that is recommended by your health care provider.  If you vomit, drink water, juice, or soup when you can drink without vomiting.  Make sure you have little or no nausea before eating solid foods. General instructions  Take over-the-counter and prescription medicines only as told by your health care provider.  If you have sleep apnea, surgery and certain medicines can increase your risk for breathing problems. Follow instructions from your health care provider about wearing your sleep device: ? Anytime you are sleeping, including during daytime naps. ? While taking prescription pain medicines, sleeping medicines, or medicines that make you drowsy.  If you smoke, do not smoke without supervision.  Keep all follow-up visits as told by your health care provider. This is important. Contact a health care provider if:  You keep feeling nauseous or you keep vomiting.  You feel light-headed.  You develop a rash.  You have a fever. Get help right away if:  You have trouble breathing. Summary  For several hours after your procedure, you may feel sleepy and have poor judgment.  Have a responsible adult stay with you for at least 24 hours or until you are awake and alert. This information is not intended to replace advice given to you by your health care provider. Make sure you discuss any questions you have with your health care provider. Document Revised: 01/11/2018 Document Reviewed: 02/03/2016 Elsevier Patient Education  [employer].

## 2020-05-03 NOTE — H&P (Signed)
@LOGO @   Primary Care Physician:  Keane Police, MD Primary Gastroenterologist:  Dr. Gala Romney  Pre-Procedure History & Physical: HPI:  Shawn Morgan is a 64 y.o. male is here for a screening colonoscopy.  [place]  No bowel symptoms currently.  History of adrenal insufficiency discussed with anesthesia.    Past Medical History:  Diagnosis Date  . Adrenal insufficiency ([place]) 2015  . Allergy to alpha-gal   . Atypical nevus 05/19/2016   Mid Back-Mild  . Hypothyroidism 2015  . Pancreatitis due to biliary obstruction 01/2020  . Rheumatoid arthritis ([place])    PSORIATIC INITIALLY AT AGE 46-30  . Sleep apnea     Past Surgical History:  Procedure Laterality Date  . ANTERIOR FUSION CERVICAL SPINE  2002  . ARTHROSCOPY Right 2010  . CHOLECYSTECTOMY    . COLONOSCOPY  04/2010  . LUMBAR FUSION  2010  . TONSILLECTOMY    . VASECTOMY  2010    Prior to Admission medications   Medication Sig Start Date End Date Taking? Authorizing Provider  hydrocortisone (CORTEF) 10 MG tablet Take 5-10 mg by mouth See admin instructions. Take 10 mg by mouth in the morning and 5 mg in evening   Yes [provider]  levothyroxine (SYNTHROID) 100 MCG tablet Take 100 mcg by mouth daily before breakfast.  01/10/20  Yes [provider]  loratadine (CLARITIN) 10 MG tablet Take 10 mg by mouth in the morning and at bedtime.    Yes [provider]  methotrexate (RHEUMATREX) 2.5 MG tablet Take 25 mg by mouth in the morning. Caution:Chemotherapy. Protect from light.   Yes [provider]  omeprazole (PRILOSEC) 40 MG capsule Take 40 mg by mouth daily.   Yes [provider]  ondansetron (ZOFRAN) 4 MG tablet Take 4 mg by mouth every 8 (eight) hours as needed for nausea or vomiting.  01/17/20  Yes [provider]  promethazine (PHENERGAN) 25 MG tablet Take 25 mg by mouth every 6 (six) hours as needed for nausea or vomiting.   Yes  [provider]  tamsulosin (FLOMAX) 0.4 MG CAPS capsule Take 0.4 mg by mouth at bedtime.    Yes [provider]  traMADol (ULTRAM) 50 MG tablet Take 50 mg by mouth every 12 (twelve) hours as needed for moderate pain.  01/06/20  Yes [provider]  folic acid (FOLVITE) 1 MG tablet Take 1 mg by mouth daily.  05/09/10   [provider]  hydrocortisone-pramoxine (PROCTOFOAM-HC) rectal foam Place 1 applicator rectally 2 (two) times daily.    [provider]    Allergies as of 01/26/2020 - Review Complete 01/26/2020  Allergen Reaction Noted  . 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 Breast cancer Mother   . Angioedema Father        ISCHEMIC BOWEL  . Crohn's disease Brother   . Colon cancer Neg Hx   . Colon polyps Neg Hx     Social History   Socioeconomic History  . Marital status: Married    Spouse name: Not on file  . Number of children: Not on file  . Years of education: Not on file  . Highest education level: Not on file  Occupational History  . Not on file  Tobacco Use  . Smoking status: Never Smoker  . Smokeless tobacco: Never Used  Vaping Use  . Vaping Use: Never  used  Substance and Sexual Activity  . Alcohol use: Never  . Drug use: Never  . Sexual activity: Not on file  Other Topics Concern  . Not on file  Social History Narrative   RETIRED CRNA SINCE 2009. MARRIED(2000)-kids(four), grandkids(3). SPENDS FREE TIME: PLANTING FLOWERS, "YARD NUT", HUNTING(DEER, RABBIT), FISHING. WIFE IS A FORMER NEONATAL NURSE.   Social Determinants of Health   Financial Resource Strain:   . Difficulty of Paying Living Expenses:   Food Insecurity:   . Worried About Charity fundraiser in the Last Year:   . Arboriculturist in the Last Year:   Transportation Needs:   . Film/video editor (Medical):   Marland Kitchen Lack of Transportation (Non-Medical):    Physical Activity:   . Days of Exercise per Week:   . Minutes of Exercise per Session:   Stress:   . Feeling of Stress :   Social Connections:   . Frequency of Communication with Friends and Family:   . Frequency of Social Gatherings with Friends and Family:   . Attends Religious Services:   . Active Member of Clubs or Organizations:   . Attends Archivist Meetings:   Marland Kitchen Marital Status:   Intimate Partner Violence:   . Fear of Current or Ex-Partner:   . Emotionally Abused:   Marland Kitchen Physically Abused:   . Sexually Abused:     Review of Systems: See HPI, otherwise negative ROS  Physical Exam: BP 116/75   Pulse 72   Temp 98 F (36.7 C) (Oral)   Resp 14   SpO2 95%  General:   Alert,  Well-developed, well-nourished, pleasant and cooperative in NAD Neck:  Supple; no masses or thyromegaly. Lungs:  Clear throughout to auscultation.   No wheezes, crackles, or rhonchi. No acute distress. Heart:  Regular rate and rhythm; no murmurs, clicks, rubs,  or gallops. Abdomen:  Soft, nontender and nondistended. No masses, hepatosplenomegaly or hernias noted. Normal bowel sounds, without guarding, and without rebound.    Impression/Plan: Shawn Morgan is now here to undergo a screening colonoscopy.  Average rescreening examination  Risks, benefits, limitations, imponderables and alternatives regarding colonoscopy have been reviewed with the patient. Questions have been answered. All parties agreeable.     Notice:  This dictation was prepared with Dragon dictation along with smaller phrase technology. Any transcriptional errors that result from this process are unintentional and may not be corrected upon review.

## 2020-05-03 NOTE — Anesthesia Postprocedure Evaluation (Signed)
Anesthesia Post Note  Patient: Shawn Morgan  Procedure(s) Performed: COLONOSCOPY WITH PROPOFOL (N/A )  Patient location during evaluation: PACU Anesthesia Type: General Level of consciousness: awake, oriented, awake and alert and patient cooperative Pain management: pain level controlled Vital Signs Assessment: post-procedure vital signs reviewed and stable Respiratory status: spontaneous breathing, respiratory function stable, nonlabored ventilation and patient connected to nasal cannula oxygen Cardiovascular status: blood pressure returned to baseline and stable Postop Assessment: no headache and no backache Anesthetic complications: no   No complications documented.   Last Vitals:  Vitals:   05/03/20 1145  BP: 116/75  Pulse: 72  Resp: 14  Temp: 36.7 C  SpO2: 95%    Last Pain:  Vitals:   05/03/20 1237  TempSrc:   PainSc: 4                  Tacy Learn

## 2020-05-03 NOTE — Anesthesia Preprocedure Evaluation (Addendum)
Anesthesia Evaluation  Patient identified by MRN, date of birth, ID band Patient awake    Reviewed: Allergy & Precautions, NPO status , Patient's Chart, lab work & pertinent test results  History of Anesthesia Complications Negative for: history of anesthetic complications  Airway Mallampati: II  TM Distance: >3 FB Neck ROM: Full   Comment: ACDF Dental  (+) Teeth Intact, Dental Advisory Given   Pulmonary sleep apnea and Oxygen sleep apnea ,    Pulmonary exam normal breath sounds clear to auscultation- rhonchi (-) decreased breath sounds(-) wheezing (-) stridorstable (-) rales(-) intubated    Cardiovascular Exercise Tolerance: Good + Peripheral Vascular Disease (AAA - 4.5 CM)  Normal cardiovascular exam+ Valvular Problems/Murmurs (bifid aortic valve)  Rhythm:Regular Rate:Normal - Systolic murmurs, - Diastolic murmurs, - Friction Rub, - Carotid Bruit, - Peripheral Edema and - Systolic Click    Neuro/Psych negative neurological ROS  negative psych ROS   GI/Hepatic GERD  Medicated and Controlled,  Endo/Other  Hypothyroidism Adrenal insufficiency   Renal/GU      Musculoskeletal  (+) Arthritis  (ACDF), Rheumatoid disorders,    Abdominal   Peds  Hematology   Anesthesia Other Findings   Reproductive/Obstetrics                           Anesthesia Physical Anesthesia Plan  ASA: III  Anesthesia Plan: General   Post-op Pain Management:    Induction: Intravenous  PONV Risk Score and Plan: 1  Airway Management Planned: Nasal Cannula, Natural Airway and Simple Face Mask  Additional Equipment:   Intra-op Plan:   Post-operative Plan:   Informed Consent: I have reviewed the patients History and Physical, chart, labs and discussed the procedure including the risks, benefits and alternatives for the proposed anesthesia with the patient or authorized representative who has indicated his/her  understanding and acceptance.     Dental advisory given  Plan Discussed with: CRNA and Surgeon  Anesthesia Plan Comments:         Anesthesia Quick Evaluation

## 2020-05-10 ENCOUNTER — Encounter (HOSPITAL_COMMUNITY): Payer: Self-pay | Admitting: Internal Medicine

## 2020-07-17 ENCOUNTER — Ambulatory Visit: Payer: Medicare PPO | Admitting: Dermatology

## 2020-08-21 ENCOUNTER — Encounter: Payer: Self-pay | Admitting: Dermatology

## 2020-08-21 ENCOUNTER — Other Ambulatory Visit: Payer: Self-pay

## 2020-08-21 ENCOUNTER — Ambulatory Visit (INDEPENDENT_AMBULATORY_CARE_PROVIDER_SITE_OTHER): Payer: Medicare Other | Admitting: Dermatology

## 2020-08-21 DIAGNOSIS — Z86018 Personal history of other benign neoplasm: Secondary | ICD-10-CM | POA: Diagnosis not present

## 2020-08-21 DIAGNOSIS — L570 Actinic keratosis: Secondary | ICD-10-CM

## 2020-08-21 DIAGNOSIS — L821 Other seborrheic keratosis: Secondary | ICD-10-CM | POA: Diagnosis not present

## 2020-08-21 DIAGNOSIS — Z1283 Encounter for screening for malignant neoplasm of skin: Secondary | ICD-10-CM | POA: Diagnosis not present

## 2020-08-21 NOTE — Patient Instructions (Signed)
Routine follow-up visit for Shawn Morgan date of birth [birth year]. All skin from waist to top of scalp examined. There are no atypical moles or melanoma. The brown rough spots on the left arm and the back of left shoulder are seborrheic keratoses and currently do not require any special care. Pink crust on the lower right scapula is more likely an irritated or lichenoid keratosis than a superficial cancer; 5-second liquid nitrogen freeze done and if this fails Shawn Morgan can return for biopsy in the future. He does have diffuse minor actinic keratoses across most of his forehead. We discussed wait-and-see versus topical Tolak versus PDT. With his familial obligations right now the preferred choice will be to wait and see but he knows if he develops any growing spots or spots that bleed I will be pleased to see him sooner. Otherwise routine follow-up in 1 year.

## 2020-08-22 ENCOUNTER — Encounter: Payer: Self-pay | Admitting: Dermatology

## 2020-08-22 NOTE — Progress Notes (Signed)
   Follow-Up Visit   Subjective  EVERTTE SONES is a 64 y.o. male who presents for the following: Annual Exam (Patient here today for skin check.  Per patient he did have a spot on left jawline but it's now gone.).  Skin check Location:  Duration:  Quality:  Associated Signs/Symptoms: Modifying Factors:  Severity:  Timing: Context:   Objective  Well appearing patient in no apparent distress; mood and affect are within normal limits.  All skin waist up examined.   Assessment & Plan    History of atypical skin mole Mid Back  Self examined twice annually.  AK (actinic keratosis) (2) Right Upper Back; Mid Forehead  No treatment on patient's forehead at this time. Will treat with light therapy or Tolak in the future.  Destruction of lesion - Right Upper Back Complexity: simple   Destruction method: cryotherapy   Informed consent: discussed and consent obtained   Timeout:  patient name, date of birth, surgical site, and procedure verified Lesion destroyed using liquid nitrogen: Yes   Cryotherapy cycles:  5 Outcome: patient tolerated procedure well with no complications   Post-procedure details: wound care instructions given    Skin exam for malignant neoplasm Chest - Medial (Center)  Yearly skin check   Routine follow-up visit for Shawn Morgan date of birth [birth year]. All skin from waist to top of scalp examined. There are no atypical moles or melanoma. The brown rough spots on the left arm and the back of left shoulder are seborrheic keratoses and currently do not require any special care. Pink crust on the lower right scapula is more likely an irritated or lichenoid keratosis than a superficial cancer; 5-second liquid nitrogen freeze done and if this fails Mr. Laurice Record can return for biopsy in the future. He does have diffuse minor actinic keratoses across most of his forehead. We discussed wait-and-see versus topical Tolak versus PDT. With his familial  obligations right now the preferred choice will be to wait and see but he knows if he develops any growing spots or spots that bleed I will be pleased to see him sooner. Otherwise routine follow-up in 1 year.  Skin cancer screening performed today.  I, Lavonna Monarch, MD, have reviewed all documentation for this visit.  The documentation on 08/22/20 for the exam, diagnosis, procedures, and orders are all accurate and complete.
